# Patient Record
Sex: Female | Born: 2000 | Hispanic: Yes | Marital: Single | Smoking: Never smoker
Health system: Southern US, Community
[De-identification: ages and names within clinical notes are randomized; demographics above are authoritative.]

## PROBLEM LIST (undated history)

## (undated) DIAGNOSIS — Z789 Other specified health status: Secondary | ICD-10-CM

## (undated) DIAGNOSIS — R112 Nausea with vomiting, unspecified: Secondary | ICD-10-CM

## (undated) HISTORY — PX: NO PAST SURGERIES: SHX2092

## (undated) HISTORY — PX: OTHER SURGICAL HISTORY: SHX169

## (undated) HISTORY — DX: Nausea with vomiting, unspecified: R11.2

---
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
## 2023-01-08 ENCOUNTER — Other Ambulatory Visit: Payer: Self-pay

## 2023-01-08 DIAGNOSIS — K029 Dental caries, unspecified: Secondary | ICD-10-CM | POA: Insufficient documentation

## 2023-01-08 NOTE — ED Triage Notes (Signed)
Pt has right upper toothache.  Pt taking tylenol without relief.  Pt alert.

## 2023-01-09 ENCOUNTER — Emergency Department
Admission: EM | Admit: 2023-01-09 | Discharge: 2023-01-09 | Disposition: A | Discharge status: Home / Self Care | Payer: Self-pay | Attending: Emergency Medicine | Admitting: Emergency Medicine

## 2023-01-09 DIAGNOSIS — K029 Dental caries, unspecified: Secondary | ICD-10-CM

## 2023-01-09 DIAGNOSIS — K0889 Other specified disorders of teeth and supporting structures: Secondary | ICD-10-CM

## 2023-01-09 MED ORDER — AMOXICILLIN 875 MG PO TABS: 875.0000 mg | ORAL_TABLET | Freq: Two times a day (BID) | ORAL | 0 refills | Status: AC

## 2023-01-09 MED ORDER — AMOXICILLIN 500 MG PO CAPS
500.0000 mg | ORAL_CAPSULE | Freq: Once | ORAL | Status: AC
  Administered 2023-01-09: 500 mg via ORAL
  Filled 2023-01-09: qty 1

## 2023-01-09 MED ORDER — IBUPROFEN 600 MG PO TABS: 600.0000 mg | ORAL_TABLET | Freq: Four times a day (QID) | ORAL | 0 refills | Status: AC | PRN

## 2023-01-09 MED ORDER — IBUPROFEN 800 MG PO TABS
800.0000 mg | ORAL_TABLET | Freq: Once | ORAL | Status: AC
  Administered 2023-01-09: 800 mg via ORAL
  Filled 2023-01-09: qty 1

## 2023-01-09 NOTE — Discharge Instructions (Signed)
OPTIONS FOR DENTAL FOLLOW UP CARE ° °[place] Department of Health and Human Services - Local Safety Net Dental Clinics °http://www.ncdhhs.gov/dph/oralhealth/services/safetynetclinics.htm °  °[facility] (336-562-3123) ° °[facility] (919-933-9087) ° °[facility] (919-663-1744 ext [phone]) ° °[facility] ([phone]) ° °[facility] ([phone]) °This clinic caters to the indigent population and is on a lottery system. °Location: °[facility], [address], [place] °Clinic Hours: °Wednesdays from 6pm - 9pm, patients seen by a lottery system. °For dates, call or go to [email or web address].[facility].edu/[facility]/patients/Dental-[facility] °Services: °Cleanings, fillings and simple extractions. °Payment Options: °DENTAL WORK IS FREE OF CHARGE. Bring proof of income or support. °Best way to get seen: °Arrive at 5:15 pm - this is a lottery, NOT first come/first serve, so arriving earlier will not increase your chances of being seen. °  °  °[facility] °919-537-3737 °Select option 1 for emergencies °  °Location: °[facility], [address], [place] °Clinic Hours: °No walk-ins accepted - call the day before to schedule an appointment. °Check in times are 9:30 am and 1:30 pm. °Services: °Simple extractions, temporary fillings, pulpectomy/pulp debridement, uncomplicated abscess drainage. °Payment Options: °PAYMENT IS DUE AT THE TIME OF SERVICE.  Fee is usually $100-200, additional surgical procedures (e.g. abscess drainage) may be extra. °Cash, checks, Visa/MasterCard accepted.  Can file Medicaid if patient is covered for dental - patient should call case worker to check. °No discount for [facility] patients. °Best way to get seen: °MUST call the day before and get onto the schedule. Can usually be seen the next 1-2 days. No walk-ins accepted. °  °  °[facility] °[phone] °   °Location: °[facility], [address], [place] °Clinic Hours: °M, W, Th, F 8am or 1:30pm, Tues 9a or 1:30 - first come/first served. °Services: °Simple extractions, temporary fillings, uncomplicated abscess drainage.  You do not need to be an [place] resident. °Payment Options: °PAYMENT IS DUE AT THE TIME OF SERVICE. °Dental insurance, otherwise sliding scale - bring proof of income or support. °Depending on income and treatment needed, cost is usually $50-200. °Best way to get seen: °Arrive early as it is first come/first served. °  °  °[facility] °[phone] °  °Location: °[address] °Clinic Hours: °Mon-Thu 8a-5p °Services: °Most basic dental services including extractions and fillings. °Payment Options: °PAYMENT IS DUE AT THE TIME OF SERVICE. °Sliding scale, up to 50% off - bring proof if income or support. °Medicaid with dental option accepted. °Best way to get seen: °Call to schedule an appointment, can usually be seen within 2 weeks OR they will try to see walk-ins - show up at 8a or 2p (you may have to wait). °  °  °[facility] °919-245-2435 °[place] RESIDENTS ONLY °  °Location: °[facility], [address], [place], [place] [address] °Clinic Hours: By appointment only. °Monday - Thursday 8am-5pm, Friday 8am-12pm °Services: Cleanings, fillings, extractions. °Payment Options: °PAYMENT IS DUE AT THE TIME OF SERVICE. °Cash, Visa or MasterCard. Sliding scale - $30 minimum per service. °Best way to get seen: °Come in to office, complete packet and make an appointment - need proof of income °or support monies for each household member and proof of [place] residence. °Usually takes about a month to get in. °  °  °[facility] °[phone] °  °Location: °[address]   [place] °Clinic Hours: Walk-in Urgent Care Dental Services are offered Monday-Friday  mornings only. °The numbers of emergencies accepted daily is limited to the number of °providers available. °Maximum 15 - Mondays, Wednesdays & Thursdays °Maximum 10 - Tuesdays & Fridays °Services: °You do not need to be a  [place] resident to be seen for a dental emergency. °Emergencies are defined as pain, swelling, abnormal bleeding, or dental trauma. Walkins will receive x-rays if needed. °NOTE: Dental cleaning is not an emergency. °Payment Options: °PAYMENT IS DUE AT THE TIME OF SERVICE. °Minimum co-pay is $40.00 for uninsured patients. °Minimum co-pay is $3.00 for Medicaid with dental coverage. °Dental Insurance is accepted and must be presented at time of visit. °Medicare does not cover dental. °Forms of payment: Cash, credit card, checks. °Best way to get seen: °If not previously registered with the clinic, walk-in dental registration begins at 7:15 am and is on a first come/first serve basis. °If previously registered with the clinic, call to make an appointment. °  °  °[facility] °919-776-4359 °[place] RESIDENTS ONLY °  °Location: °[address], [place], [place] °Clinic Hours: °Mon-Thu 10a-2p °Services: Extractions only! °Payment Options: °FREE (donations accepted) - bring proof of income or support °Best way to get seen: °Call and schedule an appointment OR come at 8am on the 1st Monday of every month (except for holidays) when it is first come/first served. °  °  °[facility] °[phone] °  °Location: °[address], [place] °Clinic Hours: °Friday mornings °Services, Payment Options, Best way to get seen: °Call for info °

## 2023-01-09 NOTE — ED Provider Notes (Signed)
[facility] Provider Note    Event Date/Time   First MD Initiated Contact with Patient 01/09/23 0123     (approximate)   History   Dental Pain   HPI  Alexa Martinez is a 22 y.o. female  here with dental pain. Pt reports that for the past 2 days she has had aching, throbbing pain in her right upper jaw. Pt states she had no trauma. Began mild and has progressively worsened. She has felt some swelling along her right upper lip/cheek as well. No fevers. No n/v. No tongue swelling. No difficulty speaking or swallowing.       Physical Exam   Triage Vital Signs: ED Triage Vitals  Enc Vitals Group     BP 01/08/23 2331 123/84     Pulse Rate 01/08/23 2331 73     Resp 01/08/23 2331 20     Temp 01/08/23 2331 98.6 F (37 C)     Temp Source 01/08/23 2331 Oral     SpO2 01/08/23 2331 98 %     Weight 01/08/23 2332 136 lb 11 oz (62 kg)     Height 01/08/23 2332 5\' 6"  (1.676 m)     Head Circumference --      Peak Flow --      Pain Score 01/08/23 2332 10     Pain Loc --      Pain Edu? --      Excl. in GC? --     Most recent vital signs: Vitals:   01/08/23 2331  BP: 123/84  Pulse: 73  Resp: 20  Temp: 98.6 F (37 C)  SpO2: 98%     General: Awake, no distress.  CV:  Good peripheral perfusion.  Resp:  Normal work of breathing.  Abd:  No distention.  Other:  Crowded upper teeth with moderate TTP over R lateral incisor and R central incisor, with moderate gingival edema but no fluctuance. No sublingual swelling. Mild TTP along right upper medial cheek extending to NLF. No nasal airway abnormality.   ED Results / Procedures / Treatments   Labs (all labs ordered are listed, but only abnormal results are displayed) Labs Reviewed - No data to display   EKG    RADIOLOGY    I also independently reviewed and agree with radiologist interpretations.   PROCEDURES:  Critical Care performed: No   MEDICATIONS ORDERED IN ED: Medications   ibuprofen (ADVIL) tablet 800 mg (has no administration in time range)  amoxicillin (AMOXIL) capsule 500 mg (has no administration in time range)     IMPRESSION / MDM / ASSESSMENT AND PLAN / ED COURSE  I reviewed the triage vital signs and the nursing notes.                              Differential diagnosis includes, but is not limited to, dental infection, reversible pulpitis, facial cellulitis  Patient's presentation is most consistent with acute presentation with potential threat to life or bodily function.  22 yo F here with atraumatic R upper tooth pain, likely from periapical reversible pulpitis/mild dental infection. No signs of significant facial abscess or other complications. No sublingual swelling or signs of Ludwig's. She is not diabetic or immunosuppressed. Will d/c with antibiotics, analgesia, and resource guide for local dentists.     FINAL CLINICAL IMPRESSION(S) / ED DIAGNOSES   Final diagnoses:  Pain, dental  Dental caries     Rx /  DC Orders   ED Discharge Orders          Ordered    amoxicillin (AMOXIL) 875 MG tablet  2 times daily        01/09/23 0212    ibuprofen (ADVIL) 600 MG tablet  Every 6 hours PRN        01/09/23 0212             Note:  This document was prepared using Dragon voice recognition software and may include unintentional dictation errors.   Shaune Pollack, MD 01/09/23 [phone]

## 2023-01-09 NOTE — ED Notes (Signed)
Pt Dc to home. Dc instructions reviewed with all questions answered. Pt ambulatory out of dept with steady gait 

## 2023-06-12 ENCOUNTER — Ambulatory Visit: Payer: Self-pay | Admitting: Family Medicine

## 2023-08-20 ENCOUNTER — Ambulatory Visit: Payer: Self-pay

## 2023-08-28 ENCOUNTER — Encounter: Payer: Self-pay | Admitting: Advanced Practice Midwife

## 2023-08-28 ENCOUNTER — Ambulatory Visit: Payer: Self-pay

## 2023-08-28 ENCOUNTER — Ambulatory Visit: Payer: Self-pay | Admitting: Advanced Practice Midwife

## 2023-08-28 VITALS — BP 102/69 | HR 74 | Temp 98.4°F | Wt 128.0 lb

## 2023-08-28 DIAGNOSIS — Z30018 Encounter for initial prescription of other contraceptives: Secondary | ICD-10-CM

## 2023-08-28 DIAGNOSIS — Z3009 Encounter for other general counseling and advice on contraception: Secondary | ICD-10-CM

## 2023-08-28 DIAGNOSIS — Z3049 Encounter for surveillance of other contraceptives: Secondary | ICD-10-CM

## 2023-08-28 LAB — WET PREP FOR TRICH, YEAST, CLUE
Trichomonas Exam: NEGATIVE
Yeast Exam: NEGATIVE

## 2023-08-28 NOTE — Progress Notes (Signed)
[facility] [address] Main Number: [phone]   Family Planning Visit- Initial Visit  Subjective:  Alexa Martinez is a 22 y.o. SHF exsmoker  G1P1001 (76 yo son)  being seen today for an initial annual visit and to discuss reproductive life planning.  The patient is currently using Hormonal Implant for pregnancy prevention. Patient reports   does not want a pregnancy in the next year.     report they are looking for a method that provides Ready when they are   Patient has the following medical conditions does not have any active problems on file.  Chief Complaint  Patient presents with   Contraception    Patient reports here for physical, pap, Nexplanon removal. Nexplanon inserted 2020 and wants removal and condoms only. LMP not sure ?05/2023?Marland Kitchen Last sex maybe 06/2023?; with current partner x 3 months. Last cig 2024. Last vaped 2024. Last ETOH 09/04/23 (2 mixed drinks). Last dental exam 06/06/23. Last PE at [facility] but unable to see in Epic.  Patient denies cigars, MJ  Body mass index is 20.66 kg/m. - Patient is eligible for diabetes screening based on BMI> 25 and age >35?  not applicable HA1C ordered? not applicable  Patient reports 2  partner/s in last year. Desires STI screening?  No - declines bloodwork  Has patient been screened once for HCV in the past?  Yes  No results found for: "HCVAB"  Does the patient have current drug use (including MJ), have a partner with drug use, and/or has been incarcerated since last result? No  If yes-- Screen for HCV through [facility]   Does the patient meet criteria for HBV testing? Yes  Criteria:  -Household, sexual or needle sharing contact with HBV -History of drug use -HIV positive -Those with known Hep C   Health Maintenance Due  Topic Date Due   HPV VACCINES (1 - 3-dose series) Never done   Hepatitis C Screening  Never done   DTaP/Tdap/Td (1 - Tdap)  Never done   CHLAMYDIA SCREENING  03/17/2020   Cervical Cancer Screening (Pap smear)  Never done   INFLUENZA VACCINE  Never done   COVID-19 Vaccine (3 - 2024-25 season) 05/13/2023    Review of Systems  Neurological:  Positive for headaches (1x/mo in bilateral temples relieved with Tylenol).  All other systems reviewed and are negative.   The following portions of the patient's history were reviewed and updated as appropriate: allergies, current medications, past family history, past medical history, past social history, past surgical history and problem list. Problem list updated.   See flowsheet for other program required questions.  Objective:   Vitals:   08/28/23 1554  BP: 102/69  Pulse: 74  Temp: 98.4 F (36.9 C)  Weight: 128 lb (58.1 kg)    Physical Exam Constitutional:      Appearance: Normal appearance. She is normal weight.  HENT:     Head: Normocephalic and atraumatic.     Mouth/Throat:     Mouth: Mucous membranes are moist.     Comments: Good dentitiion; last dental exam 06/06/23 Eyes:     Conjunctiva/sclera: Conjunctivae normal.  Neck:     Thyroid: No thyroid mass, thyromegaly or thyroid tenderness.  Cardiovascular:     Rate and Rhythm: Normal rate and regular rhythm.  Pulmonary:     Effort: Pulmonary effort is normal.     Breath sounds: Normal breath sounds.  Abdominal:  Palpations: Abdomen is soft.     Comments: Soft without masses or tenderness, good tone  Genitourinary:    General: Normal vulva.     Exam position: Lithotomy position.     Pubic Area: No pubic lice.      Vagina: Vaginal discharge (moderate white creamy leukorrhea, ph<4.5) present.     Cervix: Normal.     Uterus: Normal.      Adnexa: Right adnexa normal and left adnexa normal.     Rectum: Normal.     Comments: No evidence of nits Pap done Musculoskeletal:        General: Normal range of motion.     Cervical back: Normal range of motion and neck supple.  Skin:    General:  Skin is warm and dry.  Neurological:     Mental Status: She is alert.  Psychiatric:        Mood and Affect: Mood normal.       Assessment and Plan:  Alexa Martinez is a 22 y.o. female presenting to the [facility] for an initial annual wellness/contraceptive visit  Contraception counseling: Reviewed options based on patient desire and reproductive life plan. Patient is interested in Female Condom. This was provided to the patient today.  if not why not clearly documented  Risks, benefits, and typical effectiveness rates were reviewed.  Questions were answered.  Written information was also given to the patient to review.    The patient will follow up in  1 years for surveillance.  The patient was told to call with any further questions, or with any concerns about this method of contraception.  Emphasized use of condoms 100% of the time for STI prevention.  Educated on ECP and assessed for need of ECP. Patient reported > 120 hours .  Reviewed options and patient desired No method of ECP, declined all    1. Family planning (Primary) Treat Wet mount per standing orders Immunization nurse consult  - WET PREP FOR TRICH, YEAST, CLUE - Chlamydia/Gonorrhea [facility] - Pap IG (Image Guided)  2. Encounter for initial prescription of other contraceptives Nexplanon removal by Francia Greaves, NP  Nexplanon Removal Patient identified, informed consent performed, consent signed.   Appropriate time out taken. Nexplanon site identified.  Area prepped in usual sterile fashon. 3 ml of 1% lidocaine with Epinephrine was used to anesthetize the area at the distal end of the implant and along implant site. A small stab incision was made right beside the implant on the distal portion.  The Nexplanon rod was grasped using left hemostats/manual and removed without difficulty.  There was minimal blood loss. There were no complications.  Steri-strips were applied over the small  incision.  A pressure bandage was applied to reduce any bruising.  The patient tolerated the procedure well and was given post procedure instructions.      Return in about 1 year (around 08/27/2024) for yearly physical exam.  No future appointments.  Alberteen Spindle, CNM

## 2023-08-28 NOTE — Progress Notes (Signed)
In house lab results reviewed during visit. Faxed ROI to [facility] BTHIELE RN

## 2023-09-07 ENCOUNTER — Encounter: Payer: Self-pay | Admitting: Advanced Practice Midwife

## 2023-09-07 DIAGNOSIS — R87619 Unspecified abnormal cytological findings in specimens from cervix uteri: Secondary | ICD-10-CM | POA: Insufficient documentation

## 2023-09-07 LAB — PAP IG (IMAGE GUIDED): PAP Smear Comment: 0
